# Patient Record
Sex: Male | Born: 2000 | Race: White | Hispanic: No | Marital: Single | Smoking: Never smoker
Health system: Southern US, Community
[De-identification: ages and names within clinical notes are randomized; demographics above are authoritative.]

---
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
## 2019-01-01 IMAGING — DX DG FOOT COMPLETE 3+V*L*
3 series · 3 of 3 positions shown · non-contrast
Comparison: None.

CLINICAL DATA: Pt was moving a pallet of water earlier today, pt
tripped over a float and pallet Eseh Lass over left foot. Pain
focused around 5th digit on foot, which radiates up to mid
metacarpal, Swelling and discoloration. No previous injury to area.
Nondiabetic.

EXAM:
LEFT FOOT - COMPLETE 3+ VIEW

[foot ap]
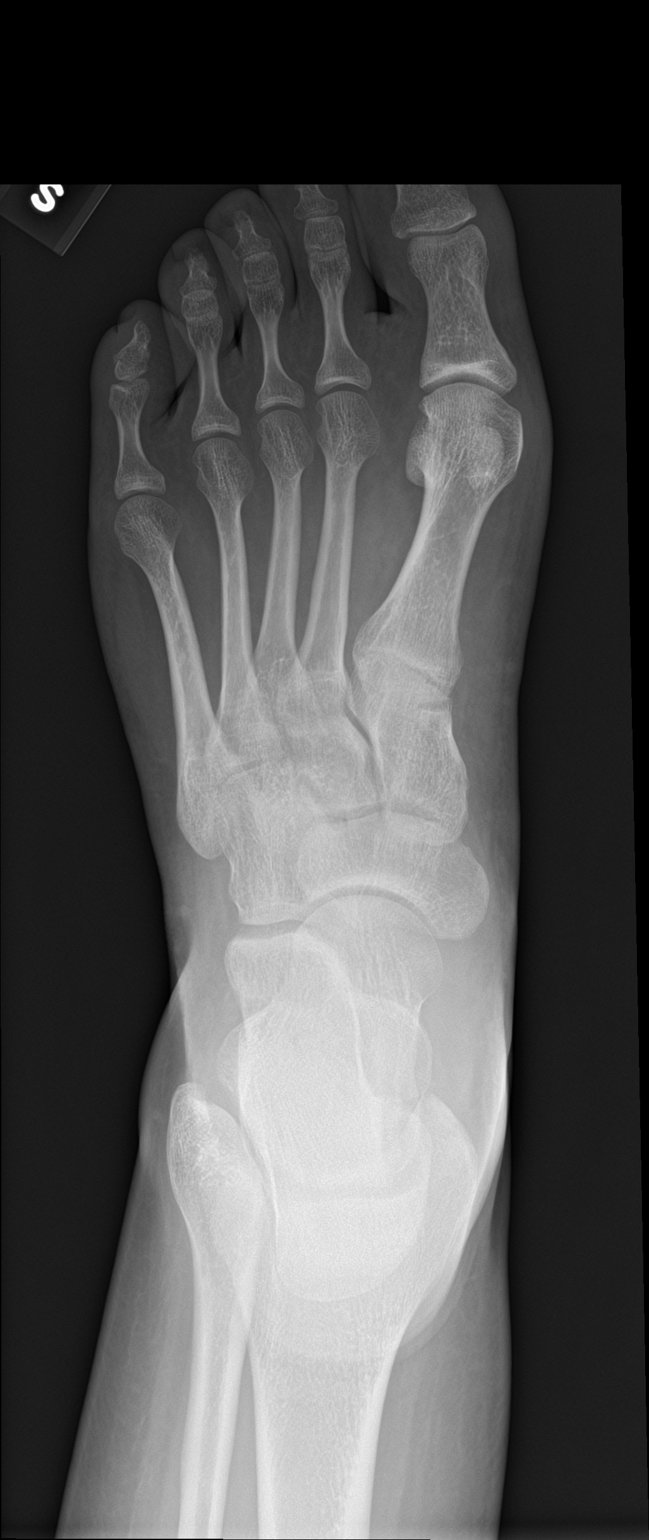

[foot obl]
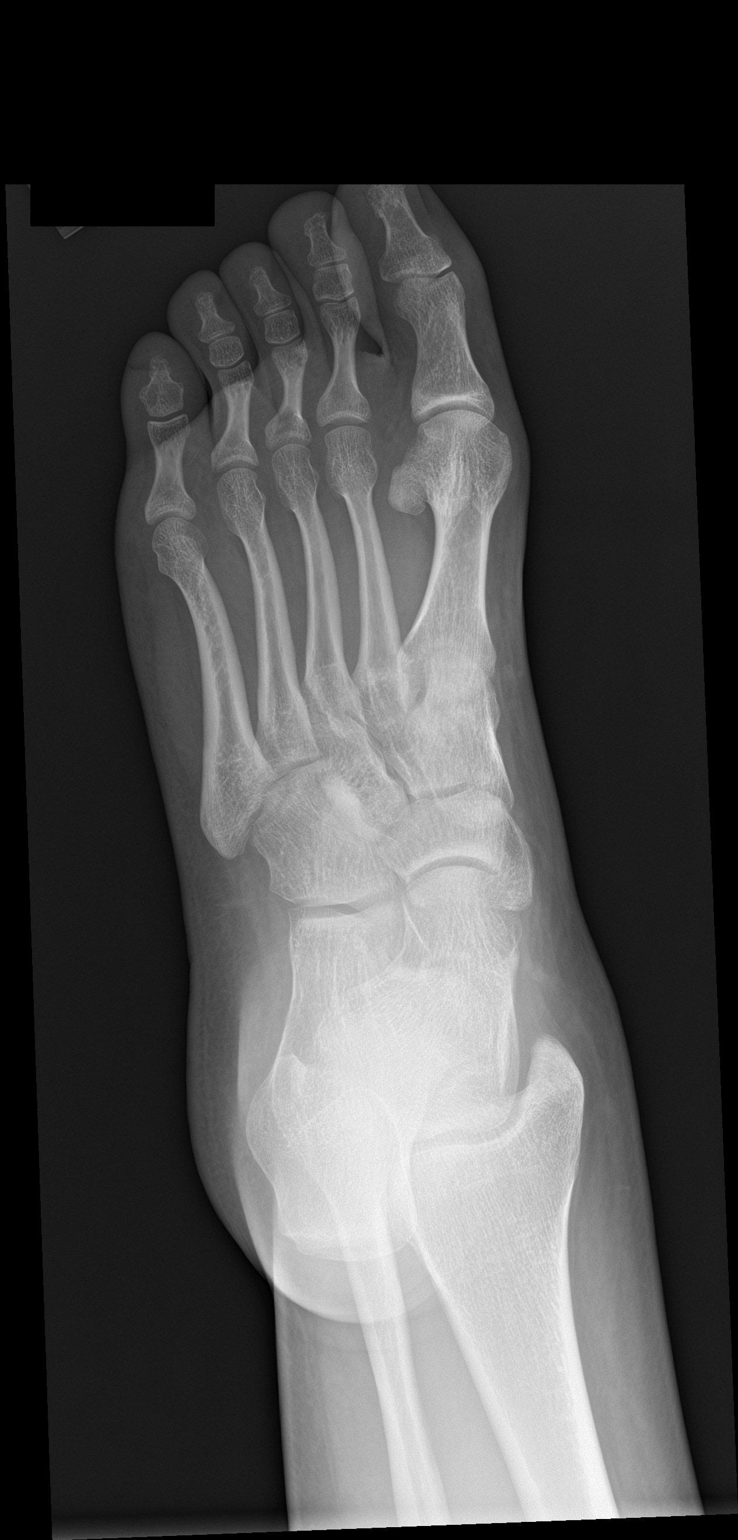

[foot lat]
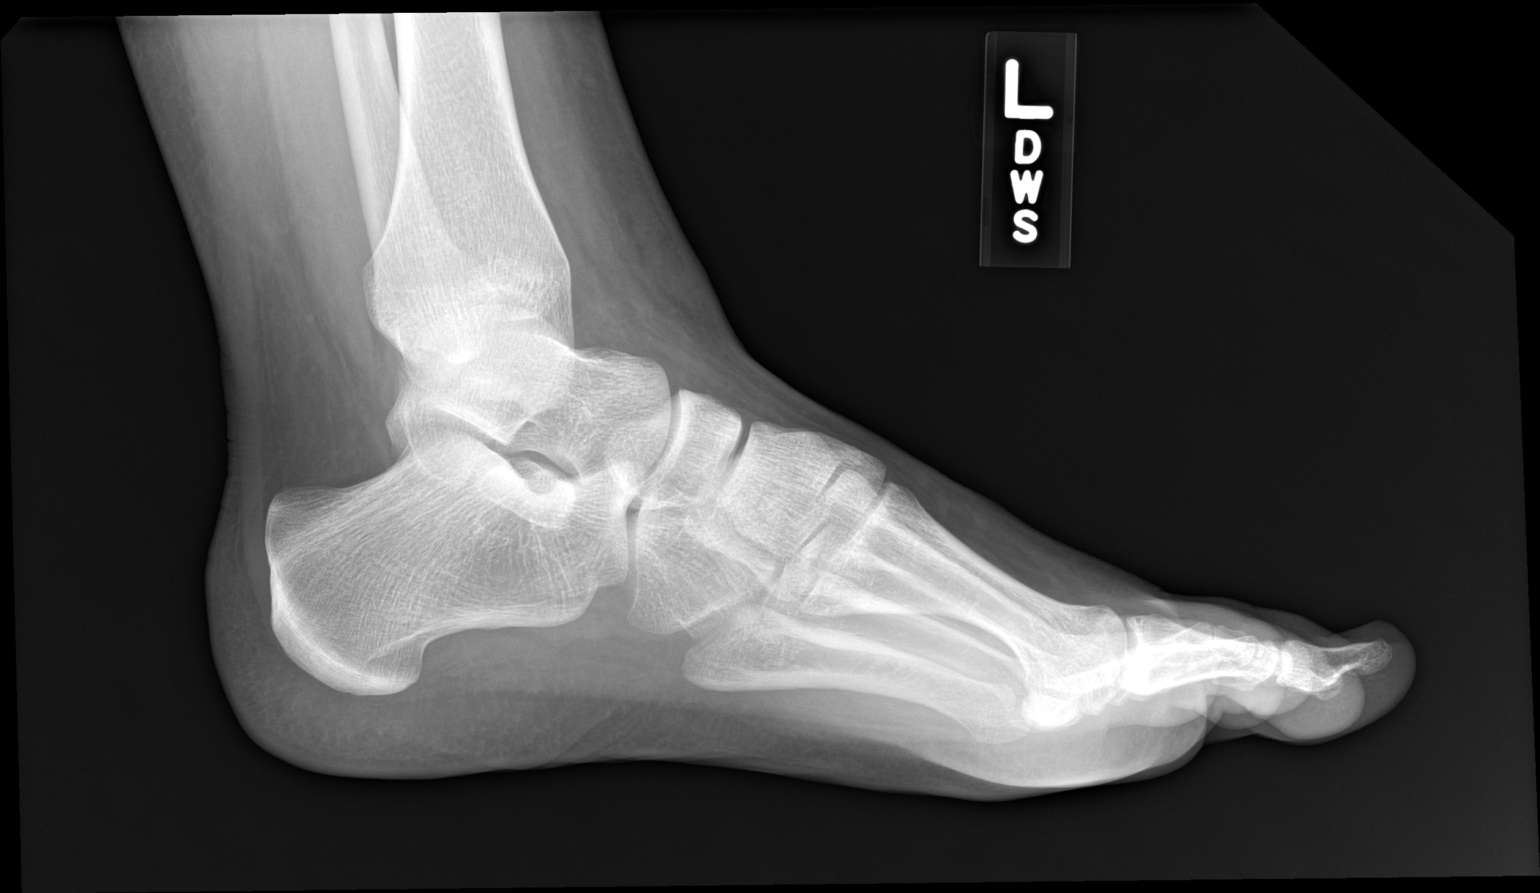

[3 of 3 positions shown; findings below may reference images not displayed]

FINDINGS: Nondisplaced fracture of the distal phalanx of the fifth digit. NO
DISLOCATION
IMPRESSION: NONDISPLACED FRACTURE OF THE DISTAL PHALANX FIFTH DIGIT

## 2019-03-27 ENCOUNTER — Ambulatory Visit (HOSPITAL_COMMUNITY)
Admission: EM | Admit: 2019-03-27 | Discharge: 2019-03-27 | Disposition: A | Discharge status: Home / Self Care | Payer: Medicaid Other | Attending: Family Medicine | Admitting: Family Medicine

## 2019-03-27 ENCOUNTER — Other Ambulatory Visit: Payer: Self-pay

## 2019-03-27 ENCOUNTER — Encounter (HOSPITAL_COMMUNITY): Payer: Self-pay

## 2019-03-27 DIAGNOSIS — R110 Nausea: Secondary | ICD-10-CM

## 2019-03-27 DIAGNOSIS — R1084 Generalized abdominal pain: Secondary | ICD-10-CM

## 2019-03-27 NOTE — ED Provider Notes (Signed)
[facility]   [id number] 03/27/19 Arrival Time: 1153  ASSESSMENT & PLAN:  1. Generalized abdominal pain   2. Nausea without vomiting     Symptoms have resolved. Work note provided.  Follow-up Information    Wilburn Mylar, MD.   Specialty: Family Medicine Why: As needed. Contact information: [address] [place] [address] [phone]           Reviewed expectations re: course of current medical issues. Questions answered. Outlined signs and symptoms indicating need for more acute intervention. Patient verbalized understanding. After Visit Summary given.   SUBJECTIVE: History from: patient. Gary Combs is a 18 y.o. male who presents with complaint of vague abdominal discomfort yesterday; associated nausea without emesis; no diarrhea. Associated fatigue. Afebrile. No specific aggravating or alleviating factors reported. Overnight symptoms were improving. No OTC tx. "Feel completely normal now. Just need a note stating I can go back to work." No known exposures to COVID-19.   No LMP for male patient. History reviewed. No pertinent surgical history.  ROS: As per HPI. All other systems negative.  OBJECTIVE:  Vitals:   03/27/19 1217  BP: (!) 119/51  Pulse: (!) 58  Resp: 17  Temp: 98.4 F (36.9 C)  TempSrc: Oral  SpO2: 99%    General appearance: alert, oriented, no acute distress HEENT: [place]; AT; oropharynx moist Lungs: clear to auscultation bilaterally; unlabored respirations Heart: regular rate and rhythm Abdomen: soft; without distention; no specific tenderness to palpation; normal bowel sounds; without masses or organomegaly; without guarding or rebound tenderness Back: without CVA tenderness; FROM at waist Extremities: without LE edema; symmetrical; without gross deformities Skin: warm and dry Neurologic: normal gait Psychological: alert and cooperative; normal mood and affect   No Known Allergies                        Social History   Socioeconomic History  . Marital status: Single    Spouse name: Not on file  . Number of children: Not on file  . Years of education: Not on file  . Highest education level: Not on file  Occupational History  . Not on file  Social Needs  . Financial resource strain: Not on file  . Food insecurity    Worry: Not on file    Inability: Not on file  . Transportation needs    Medical: Not on file    Non-medical: Not on file  Tobacco Use  . Smoking status: Never Smoker  . Smokeless tobacco: Never Used  Substance and Sexual Activity  . Alcohol use: Not on file  . Drug use: Not on file  . Sexual activity: Not on file  Lifestyle  . Physical activity    Days per week: Not on file    Minutes per session: Not on file  . Stress: Not on file  Relationships  . Social Musician on phone: Not on file    Gets together: Not on file    Attends religious service: Not on file    Active member of club or organization: Not on file    Attends meetings of clubs or organizations: Not on file    Relationship status: Not on file  . Intimate partner violence    Fear of current or ex partner: Not on file    Emotionally abused: Not on file    Physically abused: Not on file    Forced sexual activity: Not on  file  Other Topics Concern  . Not on file  Social History Narrative  . Not on file   FH: Question of HTN   Vanessa Kick, MD 03/27/19 1319

## 2019-03-27 NOTE — ED Triage Notes (Signed)
Pt presents for work note, pt states he didn't go to work yesterday.

## 2019-06-16 ENCOUNTER — Other Ambulatory Visit: Payer: Self-pay

## 2019-06-16 ENCOUNTER — Encounter (HOSPITAL_COMMUNITY): Payer: Self-pay | Admitting: Emergency Medicine

## 2019-06-16 ENCOUNTER — Ambulatory Visit (HOSPITAL_COMMUNITY)
Admission: EM | Admit: 2019-06-16 | Discharge: 2019-06-16 | Disposition: A | Discharge status: Home / Self Care | Payer: Medicaid Other | Attending: Family Medicine | Admitting: Family Medicine

## 2019-06-16 DIAGNOSIS — Z20822 Contact with and (suspected) exposure to covid-19: Secondary | ICD-10-CM

## 2019-06-16 DIAGNOSIS — R0981 Nasal congestion: Secondary | ICD-10-CM | POA: Diagnosis not present

## 2019-06-16 DIAGNOSIS — J3489 Other specified disorders of nose and nasal sinuses: Secondary | ICD-10-CM

## 2019-06-16 DIAGNOSIS — Z20828 Contact with and (suspected) exposure to other viral communicable diseases: Secondary | ICD-10-CM | POA: Diagnosis not present

## 2019-06-16 DIAGNOSIS — R430 Anosmia: Secondary | ICD-10-CM | POA: Diagnosis not present

## 2019-06-16 MED ORDER — FLUTICASONE PROPIONATE 50 MCG/ACT NA SUSP: 1.0000 | Freq: Every day | NASAL | 0 refills | Status: AC

## 2019-06-16 NOTE — Discharge Instructions (Signed)
COVID swab pending, returns in approximately 2 days, monitor my chart Rest and drink fluids Quarantine for the next 2 days May try flonase, zyrtec or mucinex for nasal congestion  Follow up if developing difficulty breathing or chest pain

## 2019-06-16 NOTE — ED Triage Notes (Signed)
Pt states he was exposed last week to a coworker who is also his roommate. Pt roommate tested positive for covid dec 7th, pt states he has been exposed to him. Pt c/o congestion and loss of smell. Lost his sense of smell on dec 7th.

## 2019-06-16 NOTE — ED Provider Notes (Signed)
[facility]    CSN: [id number] Arrival date & time: 06/16/19  1041      History   Chief Complaint Chief Complaint  Patient presents with  . Loss of smell    HPI Gary Combs is a 18 y.o. male no significant past medical history presenting today for Covid testing and nasal congestion.  Patient states that his roommate recently tested positive for Covid on 12/7.  He states that around this time he began to develop nasal congestion as well as loss of smell.  He has maintained his appetite and sense of taste.  He denies any cough.  Denies chest pain or shortness of breath.  He has not taken any medicine for his symptoms.  HPI  History reviewed. No pertinent past medical history.  There are no problems to display for this patient.   History reviewed. No pertinent surgical history.     Home Medications    Prior to Admission medications   Medication Sig Start Date End Date Taking? Authorizing Provider  fluticasone (FLONASE) 50 MCG/ACT nasal spray Place 1-2 sprays into both nostrils daily for 7 days. 06/16/19 06/23/19  Jordani Nunn, Elesa Hacker, PA-C    Family History Family History  Family history unknown: Yes    Social History Social History   Tobacco Use  . Smoking status: Never Smoker  . Smokeless tobacco: Never Used  Substance Use Topics  . Alcohol use: Yes  . Drug use: Yes    Types: Marijuana     Allergies   Patient has no known allergies.   Review of Systems Review of Systems  Constitutional: Negative for activity change, appetite change, chills, fatigue and fever.  HENT: Positive for congestion and rhinorrhea. Negative for ear pain, sinus pressure, sore throat and trouble swallowing.   Eyes: Negative for discharge and redness.  Respiratory: Negative for cough, chest tightness and shortness of breath.   Cardiovascular: Negative for chest pain.  Gastrointestinal: Negative for abdominal pain, diarrhea, nausea and vomiting.  Musculoskeletal:  Negative for myalgias.  Skin: Negative for rash.  Neurological: Negative for dizziness, light-headedness and headaches.     Physical Exam Triage Vital Signs ED Triage Vitals  Enc Vitals Group     BP 06/16/19 1137 110/65     Pulse Rate 06/16/19 1137 (!) 50     Resp 06/16/19 1137 16     Temp 06/16/19 1137 97.8 F (36.6 C)     Temp src --      SpO2 06/16/19 1137 100 %     Weight --      Height --      Head Circumference --      Peak Flow --      Pain Score 06/16/19 1143 0     Pain Loc --      Pain Edu? --      Excl. in [place]? --    No data found.  Updated Vital Signs BP 110/65   Pulse (!) 50   Temp 97.8 F (36.6 C)   Resp 16   SpO2 100%   Visual Acuity Right Eye Distance:   Left Eye Distance:   Bilateral Distance:    Right Eye Near:   Left Eye Near:    Bilateral Near:     Physical Exam Vitals and nursing note reviewed.  Constitutional:      Appearance: He is well-developed.  HENT:     Head: Normocephalic and atraumatic.     Ears:     Comments: Bilateral  ears without tenderness to palpation of external auricle, tragus and mastoid, EAC's without erythema or swelling, TM's with good bony landmarks and cone of light. Non erythematous.    Nose:     Comments: Mild amount of rhinorrhea present in bilateral nares    Mouth/Throat:     Comments: Oral mucosa pink and moist, no tonsillar enlargement or exudate. Posterior pharynx patent and nonerythematous, no uvula deviation or swelling. Normal phonation. Eyes:     Conjunctiva/sclera: Conjunctivae normal.  Cardiovascular:     Rate and Rhythm: Normal rate and regular rhythm.     Heart sounds: No murmur.  Pulmonary:     Effort: Pulmonary effort is normal. No respiratory distress.     Breath sounds: Normal breath sounds.     Comments: Breathing comfortably at rest, CTABL, no wheezing, rales or other adventitious sounds auscultated Abdominal:     Palpations: Abdomen is soft.     Tenderness: There is no abdominal  tenderness.  Musculoskeletal:     Cervical back: Neck supple.  Skin:    General: Skin is warm and dry.  Neurological:     Mental Status: He is alert.      UC Treatments / Results  Labs (all labs ordered are listed, but only abnormal results are displayed) Labs Reviewed  NOVEL CORONAVIRUS, NAA (HOSP ORDER, SEND-OUT TO REF LAB; TAT 18-24 HRS)    EKG   Radiology No results found.  Procedures Procedures (including critical care time)  Medications Ordered in UC Medications - No data to display  Initial Impression / Assessment and Plan / UC Course  I have reviewed the triage vital signs and the nursing notes.  Pertinent labs & imaging results that were available during my care of the patient were reviewed by me and considered in my medical decision making (see chart for details).     Patient with Covid exposure, mild nasal congestion with loss of smell.  Vital signs stable, lungs clear.  Covid most likely, recommending symptomatic and supportive care, rest, fluids.  Discussed quarantine recommendations.  Discussed strict return precautions. Patient verbalized understanding and is agreeable with plan.  Final Clinical Impressions(s) / UC Diagnoses   Final diagnoses:  Suspected COVID-19 virus infection     Discharge Instructions     COVID swab pending, returns in approximately 2 days, monitor my chart Rest and drink fluids Quarantine for the next 2 days May try flonase, zyrtec or mucinex for nasal congestion  Follow up if developing difficulty breathing or chest pain   ED Prescriptions    Medication Sig Dispense Auth. Provider   fluticasone (FLONASE) 50 MCG/ACT nasal spray Place 1-2 sprays into both nostrils daily for 7 days. 1 g Jasmyn Picha, [place] C, PA-C     PDMP not reviewed this encounter.   Lew Dawes, PA-C 06/16/19 1246

## 2019-06-17 LAB — NOVEL CORONAVIRUS, NAA (HOSP ORDER, SEND-OUT TO REF LAB; TAT 18-24 HRS): SARS-CoV-2, NAA: NOT DETECTED
